# Patient Record
Sex: Male | Born: 2000 | Hispanic: Yes | Marital: Single | Smoking: Never smoker
Health system: Southern US, Community
[De-identification: ages and names within clinical notes are randomized; demographics above are authoritative.]

---
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
## 2020-07-27 ENCOUNTER — Ambulatory Visit (HOSPITAL_COMMUNITY)
Admission: EM | Admit: 2020-07-27 | Discharge: 2020-07-27 | Disposition: A | Discharge status: Home / Self Care | Payer: Medicaid Other | Attending: Emergency Medicine | Admitting: Emergency Medicine

## 2020-07-27 ENCOUNTER — Encounter (HOSPITAL_COMMUNITY): Payer: Self-pay | Admitting: *Deleted

## 2020-07-27 ENCOUNTER — Other Ambulatory Visit: Payer: Self-pay

## 2020-07-27 DIAGNOSIS — M545 Low back pain, unspecified: Secondary | ICD-10-CM

## 2020-07-27 MED ORDER — METHOCARBAMOL 500 MG PO TABS: 500.0000 mg | ORAL_TABLET | Freq: Three times a day (TID) | ORAL | 0 refills | Status: AC | PRN

## 2020-07-27 MED ORDER — MELOXICAM 15 MG PO TABS: 15.0000 mg | ORAL_TABLET | Freq: Every day | ORAL | 0 refills | Status: AC

## 2022-01-17 ENCOUNTER — Ambulatory Visit (HOSPITAL_COMMUNITY)
Admission: EM | Admit: 2022-01-17 | Discharge: 2022-01-17 | Disposition: A | Discharge status: Home / Self Care | Payer: Medicaid Other | Attending: Family Medicine | Admitting: Family Medicine

## 2022-01-17 ENCOUNTER — Other Ambulatory Visit: Payer: Self-pay

## 2022-01-17 ENCOUNTER — Encounter (HOSPITAL_COMMUNITY): Payer: Self-pay | Admitting: Emergency Medicine

## 2022-01-17 DIAGNOSIS — J069 Acute upper respiratory infection, unspecified: Secondary | ICD-10-CM | POA: Diagnosis not present

## 2022-01-17 MED ORDER — PROMETHAZINE-DM 6.25-15 MG/5ML PO SYRP: 5.0000 mL | ORAL_SOLUTION | Freq: Four times a day (QID) | ORAL | 0 refills | Status: AC | PRN

## 2022-07-13 ENCOUNTER — Encounter (HOSPITAL_COMMUNITY): Payer: Self-pay

## 2022-07-13 ENCOUNTER — Other Ambulatory Visit: Payer: Self-pay

## 2022-07-13 ENCOUNTER — Emergency Department (HOSPITAL_COMMUNITY)
Admission: EM | Admit: 2022-07-13 | Discharge: 2022-07-13 | Disposition: A | Discharge status: Home / Self Care | Payer: Medicaid Other | Attending: Emergency Medicine | Admitting: Emergency Medicine

## 2022-07-13 ENCOUNTER — Emergency Department (HOSPITAL_COMMUNITY): Payer: Medicaid Other

## 2022-07-13 DIAGNOSIS — T1490XA Injury, unspecified, initial encounter: Secondary | ICD-10-CM | POA: Diagnosis not present

## 2022-07-13 DIAGNOSIS — Z23 Encounter for immunization: Secondary | ICD-10-CM | POA: Insufficient documentation

## 2022-07-13 DIAGNOSIS — S51811A Laceration without foreign body of right forearm, initial encounter: Secondary | ICD-10-CM | POA: Diagnosis not present

## 2022-07-13 DIAGNOSIS — W25XXXA Contact with sharp glass, initial encounter: Secondary | ICD-10-CM | POA: Diagnosis not present

## 2022-07-13 DIAGNOSIS — S51821A Laceration with foreign body of right forearm, initial encounter: Secondary | ICD-10-CM | POA: Insufficient documentation

## 2022-07-13 DIAGNOSIS — T07XXXA Unspecified multiple injuries, initial encounter: Secondary | ICD-10-CM

## 2022-07-13 MED ORDER — CEPHALEXIN 500 MG PO CAPS: 500.0000 mg | ORAL_CAPSULE | Freq: Four times a day (QID) | ORAL | 0 refills | Status: AC

## 2022-07-13 MED ORDER — TETANUS-DIPHTH-ACELL PERTUSSIS 5-2.5-18.5 LF-MCG/0.5 IM SUSY
0.5000 mL | PREFILLED_SYRINGE | Freq: Once | INTRAMUSCULAR | Status: AC
  Administered 2022-07-13: 0.5 mL via INTRAMUSCULAR
  Filled 2022-07-13: qty 0.5

## 2022-07-13 NOTE — ED Triage Notes (Signed)
Pt arrived to triage complaining of multiple arm lacerations, pt states he punched his car window and it shattered.   Bleeding is controlled at this time.   Pt is concerned that there may be glass in the wounds

## 2022-07-13 NOTE — ED Notes (Signed)
Pt wounds on right arm was irrigated with sterile saline, then dried off and neosporin place don wounds with a non-adherent dressing as a covering and [place] to secure the dressing

## 2022-07-13 NOTE — ED Provider Notes (Signed)
[facility] Provider Note   CSN: [id number] Arrival date & time: 07/13/22  0014     History  Chief Complaint  Patient presents with   Arm Injury    Mike Roberson is a 22 y.o. male.  The history is provided by the patient and medical records.  Arm Injury   22 year old male presenting to the ED with lacerations to right arm after punching his car window causing it to shatter.  He sustained multiple small superficial wounds to the forearm.  He does not have any active bleeding.  States he only came in because he was concerned there may be glass in his wounds.  Unsure of tetanus status.  Home Medications Prior to Admission medications   Medication Sig Start Date End Date Taking? Authorizing Provider  ibuprofen (ADVIL,MOTRIN) 600 MG tablet Take 1 tablet (600 mg total) by mouth every 12 (twelve) hours as needed. Patient not taking: Reported on 12/16/2015 07/24/15   Thurman Coyer, DO      Allergies    Patient has no known allergies.    Review of Systems   Review of Systems  Skin:  Positive for wound.  All other systems reviewed and are negative.   Physical Exam Updated Vital Signs BP 121/81   Pulse (!) 108   Temp 98 F (36.7 C) (Oral)   Resp 16   Ht 5\' 11"  (1.803 m)   Wt 77.1 kg   SpO2 95%   BMI 23.71 kg/m   Physical Exam Vitals and nursing note reviewed.  Constitutional:      Appearance: He is well-developed.  HENT:     Head: Normocephalic and atraumatic.  Eyes:     Conjunctiva/sclera: Conjunctivae normal.     Pupils: Pupils are equal, round, and reactive to light.  Cardiovascular:     Rate and Rhythm: Normal rate and regular rhythm.     Heart sounds: Normal heart sounds.  Pulmonary:     Effort: Pulmonary effort is normal.     Breath sounds: Normal breath sounds.  Abdominal:     General: Bowel sounds are normal.     Palpations: Abdomen is soft.  Musculoskeletal:        General: Normal range of motion.      Cervical back: Normal range of motion.     Comments: Multiple, small (<1cm) wounds noted to the volar right forearm, there is no active bleeding, does appear to have some very small shards of glass present on the skin, no bony deformities, arm is NVI  Skin:    General: Skin is warm and dry.  Neurological:     Mental Status: He is alert and oriented to person, place, and time.     ED Results / Procedures / Treatments   Labs (all labs ordered are listed, but only abnormal results are displayed) Labs Reviewed - No data to display  EKG None  Radiology DG Forearm Right  Result Date: 07/13/2022 CLINICAL DATA:  Trauma EXAM: RIGHT FOREARM - 2 VIEW COMPARISON:  None Available. FINDINGS: There is no evidence of fracture or other focal bone lesions. Soft tissues are unremarkable. IMPRESSION: Negative. Electronically Signed   By: Ulyses Jarred M.D.   On: 07/13/2022 00:56    Procedures Procedures    Medications Ordered in ED Medications  Tdap (BOOSTRIX) injection 0.5 mL (has no administration in time range)    ED Course/ Medical Decision Making/ A&P  Medical Decision Making Amount and/or Complexity of Data Reviewed Radiology: ordered and independent interpretation performed.  Risk Prescription drug management.   22 year old male presenting to the ED with numerous small (<1cm) lacerations to right volar forearm after punching out a car window.  No bony deformities.  X-ray negative.  Wounds irrigated with saline, small shards of glass removed.  Wounds are all small without ongoing bleeding, not really amenable to repair.  Tetanus updated.  Will start on ppx keflex for now, topical ointment and non-stick dressing applied.  Encouraged to continue daily wound care.  Can follow-up with PCP.  Return here for new concerns.  Final Clinical Impression(s) / ED Diagnoses Final diagnoses:  Multiple lacerations    Rx / DC Orders ED Discharge Orders     None          Larene Pickett, PA-C 07/13/22 0235    Ripley Fraise, MD 07/13/22 [phone]

## 2022-07-13 NOTE — Discharge Instructions (Addendum)
Take the prescribed medication as directed.  Continue to cleanse wounds daily with soap and water. Follow-up with your primary care doctor. Return to the ED for new or worsening symptoms-- increased redness, fever, swelling, purulent (pus) drainage, etc.
